# Patient Record
Sex: Male | Born: 1960 | Race: Black or African American | Hispanic: No | Marital: Married | Smoking: Former smoker
Health system: Southern US, Community
[De-identification: ages and names within clinical notes are randomized; demographics above are authoritative.]

## PROBLEM LIST (undated history)

## (undated) DIAGNOSIS — C189 Malignant neoplasm of colon, unspecified: Secondary | ICD-10-CM

## (undated) DIAGNOSIS — I10 Essential (primary) hypertension: Secondary | ICD-10-CM

## (undated) HISTORY — PX: COLOSTOMY: SHX63

## (undated) HISTORY — PX: PORTACATH PLACEMENT: SHX2246

## (undated) HISTORY — PX: HERNIA REPAIR: SHX51

---

## 2015-04-29 ENCOUNTER — Emergency Department
Admission: EM | Admit: 2015-04-29 | Discharge: 2015-04-29 | Disposition: A | Discharge status: Home / Self Care | Payer: Medicaid Other | Attending: Emergency Medicine | Admitting: Emergency Medicine

## 2015-04-29 ENCOUNTER — Encounter: Payer: Self-pay | Admitting: *Deleted

## 2015-04-29 ENCOUNTER — Emergency Department: Payer: Medicaid Other

## 2015-04-29 DIAGNOSIS — R110 Nausea: Secondary | ICD-10-CM | POA: Diagnosis not present

## 2015-04-29 DIAGNOSIS — R109 Unspecified abdominal pain: Secondary | ICD-10-CM

## 2015-04-29 DIAGNOSIS — Z933 Colostomy status: Secondary | ICD-10-CM | POA: Insufficient documentation

## 2015-04-29 DIAGNOSIS — R1010 Upper abdominal pain, unspecified: Secondary | ICD-10-CM | POA: Insufficient documentation

## 2015-04-29 DIAGNOSIS — Z87891 Personal history of nicotine dependence: Secondary | ICD-10-CM | POA: Diagnosis not present

## 2015-04-29 HISTORY — DX: Essential (primary) hypertension: I10

## 2015-04-29 HISTORY — DX: Malignant neoplasm of colon, unspecified: C18.9

## 2015-04-29 LAB — COMPREHENSIVE METABOLIC PANEL
ALT: 27 U/L (ref 17–63)
AST: 26 U/L (ref 15–41)
Albumin: 4.3 g/dL (ref 3.5–5.0)
Alkaline Phosphatase: 60 U/L (ref 38–126)
Anion gap: 9 (ref 5–15)
BUN: 15 mg/dL (ref 6–20)
CHLORIDE: 100 mmol/L — AB (ref 101–111)
CO2: 26 mmol/L (ref 22–32)
Calcium: 9.5 mg/dL (ref 8.9–10.3)
Creatinine, Ser: 1.03 mg/dL (ref 0.61–1.24)
Glucose, Bld: 116 mg/dL — ABNORMAL HIGH (ref 65–99)
POTASSIUM: 4.2 mmol/L (ref 3.5–5.1)
SODIUM: 135 mmol/L (ref 135–145)
Total Bilirubin: 0.7 mg/dL (ref 0.3–1.2)
Total Protein: 8 g/dL (ref 6.5–8.1)

## 2015-04-29 LAB — CBC
HCT: 40.8 % (ref 40.0–52.0)
Hemoglobin: 14 g/dL (ref 13.0–18.0)
MCH: 28.3 pg (ref 26.0–34.0)
MCHC: 34.3 g/dL (ref 32.0–36.0)
MCV: 82.7 fL (ref 80.0–100.0)
PLATELETS: 202 10*3/uL (ref 150–440)
RBC: 4.93 MIL/uL (ref 4.40–5.90)
RDW: 14.3 % (ref 11.5–14.5)
WBC: 5.2 10*3/uL (ref 3.8–10.6)

## 2015-04-29 LAB — TROPONIN I

## 2015-04-29 LAB — LIPASE, BLOOD: LIPASE: 30 U/L (ref 11–51)

## 2015-04-29 MED ORDER — IOHEXOL 240 MG/ML SOLN
25.0000 mL | INTRAMUSCULAR | Status: AC
  Administered 2015-04-29: 25 mL via ORAL

## 2015-04-29 MED ORDER — OXYCODONE HCL 5 MG PO TABS
10.0000 mg | ORAL_TABLET | ORAL | Status: AC
  Administered 2015-04-29: 10 mg via ORAL
  Filled 2015-04-29: qty 2

## 2015-04-29 MED ORDER — MORPHINE SULFATE (PF) 4 MG/ML IV SOLN
4.0000 mg | Freq: Once | INTRAVENOUS | Status: AC
  Administered 2015-04-29: 4 mg via INTRAVENOUS
  Filled 2015-04-29: qty 1

## 2015-04-29 MED ORDER — OXYCODONE-ACETAMINOPHEN 10-325 MG PO TABS: 1.0000 | ORAL_TABLET | Freq: Four times a day (QID) | ORAL | Status: AC | PRN

## 2015-04-29 MED ORDER — ONDANSETRON HCL 4 MG/2ML IJ SOLN
4.0000 mg | Freq: Once | INTRAMUSCULAR | Status: AC
  Administered 2015-04-29: 4 mg via INTRAVENOUS
  Filled 2015-04-29: qty 2

## 2015-04-29 MED ORDER — SODIUM CHLORIDE 0.9 % IV BOLUS (SEPSIS)
1000.0000 mL | Freq: Once | INTRAVENOUS | Status: AC
  Administered 2015-04-29: 1000 mL via INTRAVENOUS

## 2015-04-29 MED ORDER — IOHEXOL 300 MG/ML  SOLN
100.0000 mL | Freq: Once | INTRAMUSCULAR | Status: AC | PRN
  Administered 2015-04-29: 100 mL via INTRAVENOUS

## 2015-04-29 NOTE — Discharge Instructions (Signed)
Please seek medical attention for any high fevers, chest pain, shortness of breath, change in behavior, persistent vomiting, bloody stool or any other new or concerning symptoms. ° ° °Abdominal Pain, Adult °Many things can cause abdominal pain. Usually, abdominal pain is not caused by a disease and will improve without treatment. It can often be observed and treated at home. Your health care provider will do a physical exam and possibly order blood tests and X-rays to help determine the seriousness of your pain. However, in many cases, more time must pass before a clear cause of the pain can be found. Before that point, your health care provider may not know if you need more testing or further treatment. °HOME CARE INSTRUCTIONS °Monitor your abdominal pain for any changes. The following actions may help to alleviate any discomfort you are experiencing: °· Only take over-the-counter or prescription medicines as directed by your health care provider. °· Do not take laxatives unless directed to do so by your health care provider. °· Try a clear liquid diet (broth, tea, or water) as directed by your health care provider. Slowly move to a bland diet as tolerated. °SEEK MEDICAL CARE IF: °· You have unexplained abdominal pain. °· You have abdominal pain associated with nausea or diarrhea. °· You have pain when you urinate or have a bowel movement. °· You experience abdominal pain that wakes you in the night. °· You have abdominal pain that is worsened or improved by eating food. °· You have abdominal pain that is worsened with eating fatty foods. °· You have a fever. °SEEK IMMEDIATE MEDICAL CARE IF: °· Your pain does not go away within 2 hours. °· You keep throwing up (vomiting). °· Your pain is felt only in portions of the abdomen, such as the right side or the left lower portion of the abdomen. °· You pass bloody or black tarry stools. °MAKE SURE YOU: °· Understand these instructions. °· Will watch your  condition. °· Will get help right away if you are not doing well or get worse. °  °This information is not intended to replace advice given to you by your health care provider. Make sure you discuss any questions you have with your health care provider. °  °Document Released: 03/25/2005 Document Revised: 03/06/2015 Document Reviewed: 02/22/2013 °Elsevier Interactive Patient Education ©2016 [employer] ° °

## 2015-04-29 NOTE — ED Provider Notes (Signed)
[facility] Emergency Department Provider Note   ____________________________________________  Time seen: 1800  I have reviewed the triage vital signs and the nursing notes.   HISTORY  Chief Complaint Abdominal Cramping   History limited by: Not Limited   HPI Darin Davis is a 54 y.o. 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 He states that the pain has been present and a mild extent for the past month and half ever since his resection. He states today while he was sitting down he had severe increase in pain. It is located in the upper abdomen. He has had some nausea but no vomiting. He states that his output in his colostomy has been normal. He has notany blood in it recently. No fevers.   Past Medical History  Diagnosis Date  . Colon cancer ([place])   . Colon cancer ([place])     There are no active problems to display for this patient.   Past Surgical History  Procedure Laterality Date  . Colostomy    . Hernia repair    . Portacath placement      No current outpatient prescriptions on file.  Allergies Review of patient's allergies indicates no known allergies.  History reviewed. No pertinent family history.  Social History Social History  Substance Use Topics  . Smoking status: Former Research scientist (life sciences)  . Smokeless tobacco: None  . Alcohol Use: No    Review of Systems  Constitutional: Negative for fever. Cardiovascular: Negative for chest pain. Respiratory: Negative for shortness of breath. Gastrointestinal: Positive for abdominal pain Genitourinary: Negative for dysuria. Musculoskeletal: Negative for back pain. Skin: Negative for rash. Neurological: Negative for headaches, focal weakness or numbness.  10-point ROS otherwise negative.  ____________________________________________   PHYSICAL EXAM:  VITAL SIGNS: ED Triage Vitals  Enc  Vitals Group     BP 04/29/15 1710 148/87 mmHg     Pulse Rate 04/29/15 1710 80     Resp 04/29/15 1710 20     Temp 04/29/15 1710 98.3 F (36.8 C)     Temp Source 04/29/15 1710 Oral     SpO2 04/29/15 1710 100 %     Weight 04/29/15 1710 168 lb (76.204 kg)     Height 04/29/15 1710 5\' 11"  (1.803 m)     Head Cir --      Peak Flow --      Pain Score 04/29/15 1711 8   Constitutional: Alert and oriented. Well appearing and in no distress. Eyes: Conjunctivae are normal. PERRL. Normal extraocular movements. ENT   Head: Normocephalic and atraumatic.   Nose: No congestion/rhinnorhea.   Mouth/Throat: Mucous membranes are moist.   Neck: No stridor. Hematological/Lymphatic/Immunilogical: No cervical lymphadenopathy. Cardiovascular: Normal rate, regular rhythm.  No murmurs, rubs, or gallops. Respiratory: Normal respiratory effort without tachypnea nor retractions. Breath sounds are clear and equal bilaterally. No wheezes/rales/rhonchi. Gastrointestinal: Soft and tender to palpation in the upper abdomen. Mild tympany. Colostomy bag with brown liquid fecal matter. Genitourinary: Deferred Musculoskeletal: Normal range of motion in all extremities. No joint effusions.  No lower extremity tenderness nor edema. Neurologic:  Normal speech and language. No gross focal neurologic deficits are appreciated.  Skin:  Skin is warm, dry and intact. No rash noted. Psychiatric: Mood and affect are normal. Speech and behavior are normal. Patient exhibits appropriate insight and judgment.  ____________________________________________    LABS (pertinent positives/negatives)  Labs Reviewed  COMPREHENSIVE METABOLIC PANEL -  Abnormal; Notable for the following:    Chloride 100 (*)    Glucose, Bld 116 (*)    All other components within normal limits  LIPASE, BLOOD  CBC  TROPONIN I    ____________________________________________   EKG  I, Nance Pear, attending physician, personally viewed and  interpreted this EKG  EKG Time: 1717 Rate: 80 Rhythm: NSR Axis: normal Intervals: qtc 422 QRS: narrow, rsr' pattern in V1 ST changes: no st elevation Impression: abnormal ekg ____________________________________________    RADIOLOGY  CT abdomen and pelvis IMPRESSION: 1. Dilated loop of jejunum in the left mid abdomen. This could be due to focal ileus or partial obstruction. 2. Moderate diffuse inferior rectal wall thickening. This could be due to a neoplastic process or an inflammatory/infectious process. 3. Multiple small liver masses, most likely representing metastases. 4. Small probable postoperative fluid collection in the central pelvis, posteriorly, at the level of the dome of the urinary bladder.  ____________________________________________   PROCEDURES  Procedure(s) performed: None  Critical Care performed: No  ____________________________________________   INITIAL IMPRESSION / ASSESSMENT AND PLAN / ED COURSE  Pertinent labs & imaging results that were available during my care of the patient were reviewed by me and considered in my medical decision making (see chart for details).  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 Initial exam patient did have some tenderness to palpation of the upper abdomen. A CT scan was performed which showed a possible distended jejunum concerning for possible partial obstruction. Additionally there was a postoperative fluid collection seen near the urinary bladder. The patient has not had any fevers and certainly did not have any fever or leukocytosis here. I have low suspicion that this fluid collection is infectious. The patient did feel better after pain medication and was able to tolerate by mouth. Will plan on discharging home to follow up with his surgeons.  ____________________________________________   FINAL CLINICAL  IMPRESSION(S) / ED DIAGNOSES  Final diagnoses:  Abdominal pain, unspecified abdominal location     Nance Pear, MD 04/29/15 2254

## 2015-04-29 NOTE — ED Notes (Signed)
Pain across upper abd radiating around to back past several days, chemo last week for colon ca

## 2015-04-29 NOTE — ED Notes (Signed)
Pt dc home ambulatory with brother pt rates pain 2/10 instructed on follow up plan and med use PT NAD AT DC

## 2016-06-24 ENCOUNTER — Encounter (HOSPITAL_COMMUNITY): Payer: Self-pay

## 2016-06-24 DIAGNOSIS — Z85038 Personal history of other malignant neoplasm of large intestine: Secondary | ICD-10-CM | POA: Diagnosis not present

## 2016-06-24 DIAGNOSIS — I10 Essential (primary) hypertension: Secondary | ICD-10-CM | POA: Diagnosis not present

## 2016-06-24 DIAGNOSIS — Z79899 Other long term (current) drug therapy: Secondary | ICD-10-CM | POA: Insufficient documentation

## 2016-06-24 DIAGNOSIS — Z87891 Personal history of nicotine dependence: Secondary | ICD-10-CM | POA: Diagnosis not present

## 2016-06-24 DIAGNOSIS — R109 Unspecified abdominal pain: Secondary | ICD-10-CM | POA: Diagnosis present

## 2016-06-24 DIAGNOSIS — R1032 Left lower quadrant pain: Secondary | ICD-10-CM | POA: Insufficient documentation

## 2016-06-24 NOTE — ED Triage Notes (Signed)
Patient c/o back pain greater than a month, but progressively worse in the past few days. Patient has been taking oxycodone 5/325and neurotin with no relief. Patient has a history of colon cancer and received last treatment 2 weeks ago.

## 2016-06-25 ENCOUNTER — Emergency Department (HOSPITAL_COMMUNITY): Payer: Medicaid Other

## 2016-06-25 ENCOUNTER — Emergency Department (HOSPITAL_COMMUNITY)
Admission: EM | Admit: 2016-06-25 | Discharge: 2016-06-25 | Disposition: A | Discharge status: Home / Self Care | Payer: Medicaid Other | Attending: Emergency Medicine | Admitting: Emergency Medicine

## 2016-06-25 ENCOUNTER — Encounter (HOSPITAL_COMMUNITY): Payer: Self-pay

## 2016-06-25 DIAGNOSIS — R1030 Lower abdominal pain, unspecified: Secondary | ICD-10-CM

## 2016-06-25 LAB — CBC WITH DIFFERENTIAL/PLATELET
BASOS PCT: 1 %
Basophils Absolute: 0 10*3/uL (ref 0.0–0.1)
EOS ABS: 0.1 10*3/uL (ref 0.0–0.7)
Eosinophils Relative: 1 %
HCT: 34.2 % — ABNORMAL LOW (ref 39.0–52.0)
Hemoglobin: 11.1 g/dL — ABNORMAL LOW (ref 13.0–17.0)
Lymphocytes Relative: 44 %
Lymphs Abs: 2.8 10*3/uL (ref 0.7–4.0)
MCH: 28.8 pg (ref 26.0–34.0)
MCHC: 32.5 g/dL (ref 30.0–36.0)
MCV: 88.6 fL (ref 78.0–100.0)
MONO ABS: 0.6 10*3/uL (ref 0.1–1.0)
MONOS PCT: 9 %
Neutro Abs: 2.8 10*3/uL (ref 1.7–7.7)
Neutrophils Relative %: 45 %
Platelets: 247 10*3/uL (ref 150–400)
RBC: 3.86 MIL/uL — ABNORMAL LOW (ref 4.22–5.81)
RDW: 16.8 % — AB (ref 11.5–15.5)
WBC: 6.2 10*3/uL (ref 4.0–10.5)

## 2016-06-25 LAB — COMPREHENSIVE METABOLIC PANEL
ALBUMIN: 4 g/dL (ref 3.5–5.0)
ALK PHOS: 66 U/L (ref 38–126)
ALT: 25 U/L (ref 17–63)
AST: 29 U/L (ref 15–41)
Anion gap: 8 (ref 5–15)
BILIRUBIN TOTAL: 0.6 mg/dL (ref 0.3–1.2)
BUN: 10 mg/dL (ref 6–20)
CALCIUM: 9.6 mg/dL (ref 8.9–10.3)
CO2: 27 mmol/L (ref 22–32)
Chloride: 100 mmol/L — ABNORMAL LOW (ref 101–111)
Creatinine, Ser: 0.67 mg/dL (ref 0.61–1.24)
GFR calc Af Amer: >60 mL/min (ref >60)
GFR calc non Af Amer: >60 mL/min (ref >60)
GLUCOSE: 123 mg/dL — AB (ref 65–99)
Potassium: 3.8 mmol/L (ref 3.5–5.1)
Sodium: 135 mmol/L (ref 135–145)
TOTAL PROTEIN: 7.3 g/dL (ref 6.5–8.1)

## 2016-06-25 LAB — LIPASE, BLOOD: Lipase: 27 U/L (ref 11–51)

## 2016-06-25 MED ORDER — HYDROMORPHONE HCL 2 MG/ML IJ SOLN: 1.0000 mg | Freq: Once | INTRAMUSCULAR | Status: AC

## 2016-06-25 MED ORDER — OXYCODONE-ACETAMINOPHEN 5-325 MG PO TABS: 1.0000 | ORAL_TABLET | Freq: Four times a day (QID) | ORAL | 0 refills | Status: AC | PRN

## 2016-06-25 MED ORDER — IOPAMIDOL (ISOVUE-300) INJECTION 61%
100.0000 mL | Freq: Once | INTRAVENOUS | Status: DC | PRN
Start: 2016-06-25 — End: 2016-06-25

## 2016-06-25 MED ORDER — HYDROMORPHONE HCL 2 MG/ML IJ SOLN
1.0000 mg | Freq: Once | INTRAMUSCULAR | Status: AC
  Administered 2016-06-25: 1 mg via INTRAMUSCULAR
  Filled 2016-06-25: qty 1

## 2016-06-25 MED ORDER — IOPAMIDOL (ISOVUE-300) INJECTION 61%
INTRAVENOUS | Status: AC
  Filled 2016-06-25: qty 100

## 2016-06-25 NOTE — ED Notes (Signed)
Patient is alert and oriented x3.  He was given DC instructions and follow up visit instructions.  Patient gave verbal understanding.  He was DC ambulatory under his own power to home.  V/S stable.  He was not showing any signs of distress on DC 

## 2016-06-25 NOTE — Discharge Instructions (Signed)
Please keep all scheduled appointments. Follow up with your oncologist to discuss improving pain control.  Return to ER for new or worsening symptoms, any additional concerns.

## 2016-06-25 NOTE — ED Provider Notes (Signed)
[facility] DEPT Provider Note   CSN: [id number] Arrival date & time: 06/24/16  1751   By signing my name below, I, Eunice Blase, attest that this documentation has been prepared under the direction and in the presence of [facility]. Electronically Signed: Eunice Blase, Scribe. 06/25/16. 5:04 AM.   History   Chief Complaint Chief Complaint  Patient presents with  . Back Pain  . cancer patient   The history is provided by the patient and medical records. No language interpreter was used.    HPI Comments: Darin Davis is a 55 y.o. male with PMH of stage IV colon cancer who presents to the Emergency Department complaining of Gradually worsening left-sided abdominal pain, flank pain, back pain for the last month. Patient states the pain has become intolerable over the past few days. He has taken his home 5 mg oxycodone with little relief and is now out of this medication. He has been trying to apply heating pads home with little relief. He endorses associated decreased appetite, but no nausea or vomiting. No fevers or chills.  He has a follow-up appointment with hem/onc on 07/06/2016.   Past Medical History:  Diagnosis Date  . Colon cancer ([place])   . Colon cancer ([place])   . Hypertension     There are no active problems to display for this patient.   Past Surgical History:  Procedure Laterality Date  . COLOSTOMY    . HERNIA REPAIR    . PORTACATH PLACEMENT         Home Medications    Prior to Admission medications   Medication Sig Start Date End Date Taking? Authorizing Provider  oxyCODONE-acetaminophen (PERCOCET/ROXICET) 5-325 MG tablet Take 1-2 tablets by mouth every 6 (six) hours as needed for severe pain. 06/25/16   Ozella Almond Ward, PA-C    Family History Family History  Problem Relation Age of Onset  . Heart failure Mother     Social History Social History  Substance Use Topics  . Smoking status: Former Research scientist (life sciences)  . Smokeless tobacco: Never Used  .  Alcohol use No     Allergies   Patient has no known allergies.   Review of Systems Review of Systems  Constitutional: Positive for appetite change (Diminished).  Gastrointestinal: Positive for abdominal pain. Negative for nausea and vomiting.  Musculoskeletal: Positive for back pain.  All other systems reviewed and are negative.    Physical Exam Updated Vital Signs BP 180/99 (BP Location: Left Arm)   Pulse 67   Temp 98.4 F (36.9 C) (Oral)   Resp 15   Ht 6' (1.829 m)   Wt 78 kg   SpO2 100%   BMI 23.33 kg/m   Physical Exam  Constitutional: He is oriented to person, place, and time. He appears well-developed and well-nourished. No distress.  HENT:  Head: Normocephalic and atraumatic.  Cardiovascular: Normal rate, regular rhythm and normal heart sounds.   No murmur heard. Pulmonary/Chest: Effort normal and breath sounds normal. No respiratory distress.  Abdominal: Soft. He exhibits no distension.  Colostomy in place. No abdominal tenderness.   Musculoskeletal:  No midline C/T/L spine tenderness.  Neurological: He is alert and oriented to person, place, and time.  Skin: Skin is warm and dry.  Nursing note and vitals reviewed.    ED Treatments / Results  DIAGNOSTIC STUDIES: Oxygen Saturation is 99% on RA, normal by my interpretation.    COORDINATION OF CARE: 5:04 AM Discussed treatment plan with pt at bedside and pt agreed  to plan.  Labs (all labs ordered are listed, but only abnormal results are displayed) Labs Reviewed  COMPREHENSIVE METABOLIC PANEL - Abnormal; Notable for the following:       Result Value   Chloride 100 (*)    Glucose, Bld 123 (*)    All other components within normal limits  CBC WITH DIFFERENTIAL/PLATELET - Abnormal; Notable for the following:    RBC 3.86 (*)    Hemoglobin 11.1 (*)    HCT 34.2 (*)    RDW 16.8 (*)    All other components within normal limits  LIPASE, BLOOD    EKG  EKG Interpretation None       Radiology No  results found.  Procedures Procedures (including critical care time)  Medications Ordered in ED Medications  iopamidol (ISOVUE-300) 61 % injection 100 mL (not administered)  iopamidol (ISOVUE-300) 61 % injection (not administered)  HYDROmorphone (DILAUDID) injection 1 mg ( Intravenous See Alternative 06/25/16 0131)    Or  HYDROmorphone (DILAUDID) injection 1 mg (1 mg Intramuscular Given 06/25/16 0131)     Initial Impression / Assessment and Plan / ED Course  I have reviewed the triage vital signs and the nursing notes.  Pertinent labs & imaging results that were available during my care of the patient were reviewed by me and considered in my medical decision making (see chart for details).  Clinical Course    Darin Davis is a 55 y.o. male who presents to ED for progressively worsening left sided abdominal, flank, back pain x 1 month. Hx of stage IV colon cancer undergoing chemotherapy. Labs reviewed and reassuring. Pain managed in ED. CT abd/pelvis ordered, however on re-evaluation, patient stated that he no longer wanted to wait for CT scan. He has follow up with hem/onc on 1/08 and states they can do CT as an outpatient. The patient has tolerated PO while in the ED. Abdominal exam with no peritoneal signs. Strict return precautions were discussed. All questions.   Patient seen by and discussed with Dr. Regenia Skeeter who agrees with treatment plan.   Final Clinical Impressions(s) / ED Diagnoses   Final diagnoses:  None    New Prescriptions Discharge Medication List as of 06/25/2016  3:21 AM    START taking these medications   Details  oxyCODONE-acetaminophen (PERCOCET/ROXICET) 5-325 MG tablet Take 1-2 tablets by mouth every 6 (six) hours as needed for severe pain., Starting Thu 06/25/2016, Print       I personally performed the services described in this documentation, which was scribed in my presence. The recorded information has been reviewed and is accurate.    [facility] Ward, PA-C 06/25/16 [id number]    Sherwood Gambler, MD 06/25/16 [phone]

## 2016-06-30 ENCOUNTER — Encounter: Payer: Self-pay | Admitting: *Deleted

## 2016-06-30 ENCOUNTER — Telehealth: Payer: Self-pay | Admitting: Hematology

## 2016-06-30 NOTE — Progress Notes (Signed)
Oncology Nurse Navigator Documentation  Oncology Nurse Navigator Flowsheets 06/30/2016  Navigator Location [facility]  Referral date to RadOnc/MedOnc 06/25/2016  Navigator Encounter Type Other  Abnormal Finding Date 02/27/2015  Confirmed Diagnosis Date 02/28/2015  Surgery Date 03/01/2015  Self-referral per wife to be seen in [facility] (declined [place] location). Wishes to transfer care from [facility] in [place] to [place]. Unable to download/review office notes or treatment records from [place]. Message to HIM Seth Bake) to obtain office notes, treatment records, surgical report and pathology from [facility] and have it scanned into chart. Offer appointment for 07/09/16 at 2:30 with Dr. Burr Medico. Patietn will need to bring his last CT scan on CD as well to the appointment (01/14/2016).

## 2016-06-30 NOTE — Telephone Encounter (Signed)
Tc to the pt's spouse to inform her of an appt date and time. Appt has been scheduled for the pt to see Dr. Burr Medico on 1/11 at 230pm. Demographics verified. Made Mrs. Crocker, pt's spouse, aware of the information needed prior to the scheduled appt date and time. Explained that she would need to fill out a records release from [facility] for the records to be transferred. Voiced understanding. Pt is scheduled to have a CT done on Mon 1/8.

## 2016-07-04 ENCOUNTER — Emergency Department (HOSPITAL_COMMUNITY): Payer: Medicaid Other

## 2016-07-04 ENCOUNTER — Encounter (HOSPITAL_COMMUNITY): Payer: Self-pay | Admitting: Emergency Medicine

## 2016-07-04 ENCOUNTER — Emergency Department (HOSPITAL_COMMUNITY)
Admission: EM | Admit: 2016-07-04 | Discharge: 2016-07-04 | Disposition: A | Discharge status: Home / Self Care | Payer: Medicaid Other | Attending: Emergency Medicine | Admitting: Emergency Medicine

## 2016-07-04 DIAGNOSIS — G893 Neoplasm related pain (acute) (chronic): Secondary | ICD-10-CM | POA: Diagnosis present

## 2016-07-04 DIAGNOSIS — I10 Essential (primary) hypertension: Secondary | ICD-10-CM | POA: Diagnosis not present

## 2016-07-04 DIAGNOSIS — Z79899 Other long term (current) drug therapy: Secondary | ICD-10-CM | POA: Diagnosis not present

## 2016-07-04 DIAGNOSIS — Z85038 Personal history of other malignant neoplasm of large intestine: Secondary | ICD-10-CM | POA: Insufficient documentation

## 2016-07-04 DIAGNOSIS — Z87891 Personal history of nicotine dependence: Secondary | ICD-10-CM | POA: Diagnosis not present

## 2016-07-04 LAB — URINALYSIS, ROUTINE W REFLEX MICROSCOPIC
Bilirubin Urine: NEGATIVE
GLUCOSE, UA: NEGATIVE mg/dL
Ketones, ur: NEGATIVE mg/dL
Nitrite: NEGATIVE
Protein, ur: NEGATIVE mg/dL
SPECIFIC GRAVITY, URINE: 1.025 (ref 1.005–1.030)
pH: 5.5 (ref 5.0–8.0)

## 2016-07-04 LAB — CBC WITH DIFFERENTIAL/PLATELET
Basophils Absolute: 0 10*3/uL (ref 0.0–0.1)
Basophils Relative: 0 %
EOS PCT: 3 %
Eosinophils Absolute: 0.2 10*3/uL (ref 0.0–0.7)
HCT: 37.5 % — ABNORMAL LOW (ref 39.0–52.0)
Hemoglobin: 12.4 g/dL — ABNORMAL LOW (ref 13.0–17.0)
LYMPHS ABS: 1.2 10*3/uL (ref 0.7–4.0)
LYMPHS PCT: 22 %
MCH: 29.1 pg (ref 26.0–34.0)
MCHC: 33.1 g/dL (ref 30.0–36.0)
MCV: 88 fL (ref 78.0–100.0)
MONO ABS: 0.8 10*3/uL (ref 0.1–1.0)
Monocytes Relative: 15 %
Neutro Abs: 3.3 10*3/uL (ref 1.7–7.7)
Neutrophils Relative %: 60 %
PLATELETS: 265 10*3/uL (ref 150–400)
RBC: 4.26 MIL/uL (ref 4.22–5.81)
RDW: 16.4 % — ABNORMAL HIGH (ref 11.5–15.5)
WBC: 5.5 10*3/uL (ref 4.0–10.5)

## 2016-07-04 LAB — COMPREHENSIVE METABOLIC PANEL
ALBUMIN: 4.6 g/dL (ref 3.5–5.0)
ALT: 21 U/L (ref 17–63)
ANION GAP: 11 (ref 5–15)
AST: 30 U/L (ref 15–41)
Alkaline Phosphatase: 74 U/L (ref 38–126)
BUN: 10 mg/dL (ref 6–20)
CALCIUM: 9.9 mg/dL (ref 8.9–10.3)
CHLORIDE: 96 mmol/L — AB (ref 101–111)
CO2: 25 mmol/L (ref 22–32)
Creatinine, Ser: 0.72 mg/dL (ref 0.61–1.24)
GFR calc non Af Amer: >60 mL/min (ref >60)
GLUCOSE: 108 mg/dL — AB (ref 65–99)
POTASSIUM: 3.8 mmol/L (ref 3.5–5.1)
SODIUM: 132 mmol/L — AB (ref 135–145)
Total Bilirubin: 0.8 mg/dL (ref 0.3–1.2)
Total Protein: 8.2 g/dL — ABNORMAL HIGH (ref 6.5–8.1)

## 2016-07-04 LAB — LIPASE, BLOOD: LIPASE: 17 U/L (ref 11–51)

## 2016-07-04 LAB — URINALYSIS, MICROSCOPIC (REFLEX)

## 2016-07-04 MED ORDER — ONDANSETRON HCL 4 MG/2ML IJ SOLN
4.0000 mg | Freq: Once | INTRAMUSCULAR | Status: AC
  Administered 2016-07-04: 4 mg via INTRAVENOUS
  Filled 2016-07-04: qty 2

## 2016-07-04 MED ORDER — HYDROMORPHONE HCL 1 MG/ML IJ SOLN
1.0000 mg | Freq: Once | INTRAMUSCULAR | Status: AC
  Administered 2016-07-04: 1 mg via INTRAVENOUS
  Filled 2016-07-04: qty 1

## 2016-07-04 MED ORDER — OXYCODONE HCL 5 MG PO TABS: 5.0000 mg | ORAL_TABLET | ORAL | 0 refills | Status: DC | PRN

## 2016-07-04 MED ORDER — OXYCODONE HCL 5 MG PO TABS
10.0000 mg | ORAL_TABLET | Freq: Once | ORAL | Status: AC
  Administered 2016-07-04: 10 mg via ORAL
  Filled 2016-07-04: qty 2

## 2016-07-04 MED ORDER — IOPAMIDOL (ISOVUE-300) INJECTION 61%
100.0000 mL | Freq: Once | INTRAVENOUS | Status: AC | PRN
  Administered 2016-07-04: 100 mL via INTRAVENOUS

## 2016-07-04 MED ORDER — IOPAMIDOL (ISOVUE-300) INJECTION 61%
INTRAVENOUS | Status: AC
  Filled 2016-07-04: qty 100

## 2016-07-04 MED ORDER — SODIUM CHLORIDE 0.9 % IV BOLUS (SEPSIS)
1000.0000 mL | Freq: Once | INTRAVENOUS | Status: AC
Start: 2016-07-04 — End: 2016-07-04
  Administered 2016-07-04: 1000 mL via INTRAVENOUS

## 2016-07-04 NOTE — ED Triage Notes (Signed)
Patient complaining of pain in lower back that radiates up spine. Patient is also complaining of lower abdominal pain on left and right side. Patient has a hx of cancer. Patient has not vomited but was nauseated. Patient is in a lot of pain. He states that his pain medicine is not working.

## 2016-07-04 NOTE — ED Provider Notes (Signed)
[facility] DEPT Provider Note   CSN: [id number] Arrival date & time: 07/04/16  0607     History   Chief Complaint Chief Complaint  Patient presents with  . Back Pain  . Abdominal Pain    HPI Darin Davis is a 56 y.o. male.  HPI  56 yo M with h/o colon CA here with lower abdominal pain, LLQ pain. Pt was recently seen 1-1.5 weeks ago and was noticed to have LLQ pain that is sharp and stabbing. He did not want to wait for CT and left with plan for outpt CT in 2 days. Pt states he has had persistent sharp, aching, stabbing LLQ pain with nausea. Pain is constant and worse with palpation, movement. No alleviating factors. He denies change in ostomy output. He has had decreased appetite associated with it. No fever or chills. No weight loss or night sweats.  Past Medical History:  Diagnosis Date  . Colon cancer ([place])   . Colon cancer ([place])   . Hypertension     There are no active problems to display for this patient.   Past Surgical History:  Procedure Laterality Date  . COLOSTOMY    . HERNIA REPAIR    . PORTACATH PLACEMENT         Home Medications    Prior to Admission medications   Medication Sig Start Date End Date Taking? Authorizing Provider  gabapentin (NEURONTIN) 300 MG capsule Take 300 mg by mouth daily. 02/12/16 02/11/17 Yes Historical Provider, MD  Menthol-Camphor ([place]) 16-11 % CREA Apply 1 application topically daily as needed (PAIN).   Yes Historical Provider, MD  omeprazole (PRILOSEC) 40 MG capsule Take 40 mg by mouth daily. 04/20/16  Yes Historical Provider, MD  oxyCODONE-acetaminophen (PERCOCET/ROXICET) 5-325 MG tablet Take 1-2 tablets by mouth every 6 (six) hours as needed for severe pain. 06/25/16  Yes Jaime Pilcher Ward, PA-C  VOLTAREN 1 % GEL Apply 1 application topically daily. 04/19/16  Yes Historical Provider, MD  oxyCODONE (ROXICODONE) 5 MG immediate release tablet Take 1-2 tablets (5-10 mg total) by mouth every 4 (four) hours  as needed for severe pain or breakthrough pain. 07/04/16   Duffy Bruce, MD    Family History Family History  Problem Relation Age of Onset  . Heart failure Mother     Social History Social History  Substance Use Topics  . Smoking status: Former Research scientist (life sciences)  . Smokeless tobacco: Never Used  . Alcohol use No     Allergies   Patient has no known allergies.   Review of Systems Review of Systems  Constitutional: Positive for appetite change and fatigue. Negative for chills and fever.  HENT: Negative for congestion and rhinorrhea.   Eyes: Negative for visual disturbance.  Respiratory: Negative for cough, shortness of breath and wheezing.   Cardiovascular: Negative for chest pain and leg swelling.  Gastrointestinal: Positive for abdominal pain and nausea. Negative for diarrhea and vomiting.  Genitourinary: Negative for dysuria and flank pain.  Musculoskeletal: Negative for neck pain and neck stiffness.  Skin: Negative for rash and wound.  Allergic/Immunologic: Negative for immunocompromised state.  Neurological: Negative for syncope, weakness and headaches.  All other systems reviewed and are negative.    Physical Exam Updated Vital Signs BP 153/98 (BP Location: Right Arm)   Pulse 80   Temp 98.5 F (36.9 C) (Oral)   Resp 17   Ht 6' (1.829 m)   Wt 172 lb (78 kg)   SpO2 96%   BMI  23.33 kg/m   Physical Exam  Constitutional: He is oriented to person, place, and time. He appears well-developed and well-nourished. No distress.  HENT:  Head: Normocephalic and atraumatic.  Eyes: Conjunctivae are normal.  Neck: Neck supple.  Cardiovascular: Normal rate, regular rhythm and normal heart sounds.  Exam reveals no friction rub.   No murmur heard. Pulmonary/Chest: Effort normal and breath sounds normal. No respiratory distress. He has no wheezes. He has no rales.  Abdominal: Soft. Normal appearance. He exhibits no distension. There is tenderness in the right lower quadrant,  suprapubic area and left lower quadrant. There is guarding. There is no rigidity and no rebound.  Musculoskeletal: He exhibits no edema.  Neurological: He is alert and oriented to person, place, and time. He exhibits normal muscle tone.  Skin: Skin is warm. Capillary refill takes less than 2 seconds.  Psychiatric: He has a normal mood and affect.  Nursing note and vitals reviewed.    ED Treatments / Results  Labs (all labs ordered are listed, but only abnormal results are displayed) Labs Reviewed  COMPREHENSIVE METABOLIC PANEL - Abnormal; Notable for the following:       Result Value   Sodium 132 (*)    Chloride 96 (*)    Glucose, Bld 108 (*)    Total Protein 8.2 (*)    All other components within normal limits  URINALYSIS, ROUTINE W REFLEX MICROSCOPIC - Abnormal; Notable for the following:    Hgb urine dipstick SMALL (*)    Leukocytes, UA TRACE (*)    All other components within normal limits  CBC WITH DIFFERENTIAL/PLATELET - Abnormal; Notable for the following:    Hemoglobin 12.4 (*)    HCT 37.5 (*)    RDW 16.4 (*)    All other components within normal limits  URINALYSIS, MICROSCOPIC (REFLEX) - Abnormal; Notable for the following:    Bacteria, UA RARE (*)    Squamous Epithelial / LPF 0-5 (*)    All other components within normal limits  LIPASE, BLOOD    EKG  EKG Interpretation None       Radiology Ct Abdomen Pelvis W Contrast  Result Date: 07/04/2016 CLINICAL DATA:  Lower abdominal pain, history of colon carcinoma EXAM: CT ABDOMEN AND PELVIS WITH CONTRAST TECHNIQUE: Multidetector CT imaging of the abdomen and pelvis was performed using the standard protocol following bolus administration of intravenous contrast. CONTRAST:  134mL ISOVUE-300 IOPAMIDOL (ISOVUE-300) INJECTION 61% COMPARISON:  04/29/2015 FINDINGS: Lower chest: No acute abnormality. Hepatobiliary: The liver is well visualized and demonstrates a few scattered hypodensities but less present than that seen on  the prior exam. These may represent small cysts although the possibility of metastatic disease deserves consideration given the patient's clinical history. The gallbladder is within normal limits. Pancreas: Unremarkable. No pancreatic ductal dilatation or surrounding inflammatory changes. Spleen: The spleen is well enhanced with a few small hypodensities within. These may represent small cysts but again metastatic disease could not be totally excluded on the basis of this exam. These were not well visualized on the prior exam. Adrenals/Urinary Tract: Adrenal glands are unremarkable. Kidneys are normal, without renal calculi, focal lesion, or hydronephrosis. Bladder is unremarkable. 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 No obstructive changes are seen. Some fluid-filled loops are noted within the mid abdomen similar to that seen on the prior exam. No obstructive changes are noted. No obstructive changes in the colon are seen. Vascular/Lymphatic: Aortic atherosclerosis. No enlarged abdominal or pelvic lymph nodes. Reproductive: Scattered calcifications are  noted within the prostate. Other: Mild free fluid is noted within the abdomen particularly on the right and adjacent to the liver. Mild peritoneal enhancement is noted diffusely. The possibility of peritoneal implants could not be totally excluded. No definitive peritoneal implant is noted. Musculoskeletal: No acute or significant osseous findings. IMPRESSION: Changes consistent with right lower quadrant ostomy. Mild ascites is noted as well as some peritoneal enhancement suggesting the possibility of peritoneal spread of disease although no definitive lesion is noted. Scattered hypodensities in the liver although improved from the prior study. The possibility of metastatic disease deserves consideration. New hypodensities within the spleen which demonstrate enhancement on delayed images. These are somewhat suspicious for neoplastic  involvement as well. No other focal abnormality is seen. Electronically Signed   By: Inez Catalina M.D.   On: 07/04/2016 08:34    Procedures Procedures (including critical care time)  Medications Ordered in ED Medications  HYDROmorphone (DILAUDID) injection 1 mg (1 mg Intravenous Given 07/04/16 0745)  ondansetron (ZOFRAN) injection 4 mg (4 mg Intravenous Given 07/04/16 0745)  sodium chloride 0.9 % bolus 1,000 mL (0 mLs Intravenous Stopped 07/04/16 0851)  iopamidol (ISOVUE-300) 61 % injection 100 mL (100 mLs Intravenous Contrast Given 07/04/16 0814)  HYDROmorphone (DILAUDID) injection 1 mg (1 mg Intravenous Given 07/04/16 0857)  oxyCODONE (Oxy IR/ROXICODONE) immediate release tablet 10 mg (10 mg Oral Given 07/04/16 1036)     Initial Impression / Assessment and Plan / ED Course  I have reviewed the triage vital signs and the nursing notes.  Pertinent labs & imaging results that were available during my care of the patient were reviewed by me and considered in my medical decision making (see chart for details).  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 On arrival, VSS. No fever or signs of sepsis. Exam is as above. Pt overall non-toxic appearing. Given h/o CA, concern for worsening metastatic disease, peri/retroperitoneal mets, colitis. Will check labs, CT, re-assess.  Labs, imaging as above. CBC with no leukocytosis. CMP unremarkable - normal LFTs, Bili. Renal fxn at baseline. UA without signs of UTI. CT shows metastatic peritoneal disease, possible new splenic mets, liver mets. Discussed with pt's Oncologist at [facility]. Per review of records and discussion, spleen mets may be new but pt o/w has known peritoneal mets. VS o/w stable, pain improving, and pt well-appearing. Per discussion with pt's Oncologist as well as pt and pt's wife, current plan is to provide additional analgesia for breakthrough, and pt  will f/u with Oncologist on Monday. No other apparent emeregent etiology for pain. Pt in agreement. Per discussion with pt's Oncologist, will provide #20 oxy 10 mg for breakthrough and he will refill on Monday.  Final Clinical Impressions(s) / ED Diagnoses   Final diagnoses:  Cancer associated pain    New Prescriptions Discharge Medication List as of 07/04/2016 10:23 AM    START taking these medications   Details  oxyCODONE (ROXICODONE) 5 MG immediate release tablet Take 1-2 tablets (5-10 mg total) by mouth every 4 (four) hours as needed for severe pain or breakthrough pain., Starting Sat 07/04/2016, Print         Duffy Bruce, MD 07/04/16 [phone]

## 2016-07-04 NOTE — ED Notes (Signed)
Made Dr Ellender Hose aware that wife is wanting CT results as soon as possible, due to her having to go to work.

## 2016-07-04 NOTE — Discharge Instructions (Signed)
You can take your percocet as prescribed. Take one to two oxycodone in addition to this as needed for severe or breakthrough pain.

## 2016-07-08 ENCOUNTER — Encounter (HOSPITAL_COMMUNITY): Payer: Self-pay | Admitting: Emergency Medicine

## 2016-07-08 ENCOUNTER — Emergency Department (HOSPITAL_COMMUNITY)
Admission: EM | Admit: 2016-07-08 | Discharge: 2016-07-08 | Disposition: A | Discharge status: Home / Self Care | Payer: Medicaid Other | Attending: Emergency Medicine | Admitting: Emergency Medicine

## 2016-07-08 ENCOUNTER — Emergency Department (HOSPITAL_COMMUNITY): Payer: Medicaid Other

## 2016-07-08 DIAGNOSIS — C189 Malignant neoplasm of colon, unspecified: Secondary | ICD-10-CM | POA: Diagnosis not present

## 2016-07-08 DIAGNOSIS — I10 Essential (primary) hypertension: Secondary | ICD-10-CM | POA: Insufficient documentation

## 2016-07-08 DIAGNOSIS — G893 Neoplasm related pain (acute) (chronic): Secondary | ICD-10-CM | POA: Diagnosis not present

## 2016-07-08 DIAGNOSIS — Z87891 Personal history of nicotine dependence: Secondary | ICD-10-CM | POA: Diagnosis not present

## 2016-07-08 DIAGNOSIS — K5900 Constipation, unspecified: Secondary | ICD-10-CM | POA: Diagnosis not present

## 2016-07-08 DIAGNOSIS — R109 Unspecified abdominal pain: Secondary | ICD-10-CM

## 2016-07-08 LAB — COMPREHENSIVE METABOLIC PANEL
ALT: 44 U/L (ref 17–63)
ANION GAP: 13 (ref 5–15)
AST: 52 U/L — ABNORMAL HIGH (ref 15–41)
Albumin: 4.8 g/dL (ref 3.5–5.0)
Alkaline Phosphatase: 97 U/L (ref 38–126)
BUN: 18 mg/dL (ref 6–20)
CO2: 28 mmol/L (ref 22–32)
CREATININE: 0.84 mg/dL (ref 0.61–1.24)
Calcium: 10.4 mg/dL — ABNORMAL HIGH (ref 8.9–10.3)
Chloride: 89 mmol/L — ABNORMAL LOW (ref 101–111)
Glucose, Bld: 137 mg/dL — ABNORMAL HIGH (ref 65–99)
POTASSIUM: 3.5 mmol/L (ref 3.5–5.1)
SODIUM: 130 mmol/L — AB (ref 135–145)
Total Bilirubin: 1.1 mg/dL (ref 0.3–1.2)
Total Protein: 9.3 g/dL — ABNORMAL HIGH (ref 6.5–8.1)

## 2016-07-08 LAB — URINALYSIS, ROUTINE W REFLEX MICROSCOPIC
BILIRUBIN URINE: NEGATIVE
Glucose, UA: NEGATIVE mg/dL
HGB URINE DIPSTICK: NEGATIVE
KETONES UR: NEGATIVE mg/dL
LEUKOCYTES UA: NEGATIVE
Nitrite: NEGATIVE
PH: 5 (ref 5.0–8.0)
Protein, ur: 30 mg/dL — AB
Specific Gravity, Urine: 1.021 (ref 1.005–1.030)

## 2016-07-08 LAB — CBC
HCT: 42 % (ref 39.0–52.0)
HEMOGLOBIN: 14.4 g/dL (ref 13.0–17.0)
MCH: 29.8 pg (ref 26.0–34.0)
MCHC: 34.3 g/dL (ref 30.0–36.0)
MCV: 87 fL (ref 78.0–100.0)
PLATELETS: 295 10*3/uL (ref 150–400)
RBC: 4.83 MIL/uL (ref 4.22–5.81)
RDW: 15.8 % — ABNORMAL HIGH (ref 11.5–15.5)
WBC: 9.3 10*3/uL (ref 4.0–10.5)

## 2016-07-08 LAB — LIPASE, BLOOD: LIPASE: 15 U/L (ref 11–51)

## 2016-07-08 MED ORDER — DOCUSATE SODIUM 100 MG PO CAPS: 100.0000 mg | ORAL_CAPSULE | Freq: Two times a day (BID) | ORAL | 0 refills | Status: AC

## 2016-07-08 MED ORDER — POLYETHYLENE GLYCOL 3350 17 GM/SCOOP PO POWD: 17.0000 g | Freq: Two times a day (BID) | ORAL | 0 refills | Status: AC

## 2016-07-08 MED ORDER — SODIUM CHLORIDE 0.9 % IV BOLUS (SEPSIS)
1000.0000 mL | Freq: Once | INTRAVENOUS | Status: AC
  Administered 2016-07-08: 1000 mL via INTRAVENOUS

## 2016-07-08 MED ORDER — HYDROMORPHONE HCL 1 MG/ML IJ SOLN
1.0000 mg | Freq: Once | INTRAMUSCULAR | Status: AC
  Administered 2016-07-08: 1 mg via INTRAVENOUS
  Filled 2016-07-08: qty 1

## 2016-07-08 MED ORDER — ONDANSETRON HCL 4 MG/2ML IJ SOLN
4.0000 mg | Freq: Once | INTRAMUSCULAR | Status: AC
  Administered 2016-07-08: 4 mg via INTRAVENOUS
  Filled 2016-07-08: qty 2

## 2016-07-08 NOTE — ED Provider Notes (Signed)
[facility] DEPT Provider Note   CSN: [id number] Arrival date & time: 07/08/16  0850     History   Chief Complaint Chief Complaint  Patient presents with  . Abdominal Pain  . Back Pain    HPI Darin Davis is a 56 y.o. male.  Patient with past medical history of stage IV colon cancer presents to the emergency department with chief complaint of abdominal pain. He states that he has recently relocated to [facility], and is establishing care in [place]. He was previously seen at [facility] He states that he has an appointment with his oncologist tomorrow. He states that he has had persistent worsening pain for the past week. He states that he has decreased appetite. He states that he is only able to eat a couple of bites of a sandwich per day. He complains of a 20 pound weight loss over the past month. He states that he has been having normal bowel movements through a colostomy. He reports subjective fever and chills, but denies ever having measured his temperature. He is taking oxycodone and Percocet for pain. There are no other new or associated symptoms.  Patient wife states that his ostomy is red, instead of pink.  Denies any discharge or pain around the ostomy.   The history is provided by the patient. No language interpreter was used.    Past Medical History:  Diagnosis Date  . Colon cancer ([place])   . Colon cancer ([place])   . Hypertension     There are no active problems to display for this patient.   Past Surgical History:  Procedure Laterality Date  . COLOSTOMY    . HERNIA REPAIR    . PORTACATH PLACEMENT         Home Medications    Prior to Admission medications   Medication Sig Start Date End Date Taking? Authorizing Provider  gabapentin (NEURONTIN) 300 MG capsule Take 300 mg by mouth daily. 02/12/16 02/11/17  Historical Provider, MD  Menthol-Camphor (ICY HOT ADVANCED RELIEF) 16-11 % CREA Apply 1 application topically daily as needed (PAIN).     Historical Provider, MD  omeprazole (PRILOSEC) 40 MG capsule Take 40 mg by mouth daily. 04/20/16   Historical Provider, MD  oxyCODONE (ROXICODONE) 5 MG immediate release tablet Take 1-2 tablets (5-10 mg total) by mouth every 4 (four) hours as needed for severe pain or breakthrough pain. 07/04/16   Duffy Bruce, MD  oxyCODONE-acetaminophen (PERCOCET/ROXICET) 5-325 MG tablet Take 1-2 tablets by mouth every 6 (six) hours as needed for severe pain. 06/25/16   Jaime Pilcher Ward, PA-C  VOLTAREN 1 % GEL Apply 1 application topically daily. 04/19/16   Historical Provider, MD    Family History Family History  Problem Relation Age of Onset  . Heart failure Mother     Social History Social History  Substance Use Topics  . Smoking status: Former Research scientist (life sciences)  . Smokeless tobacco: Never Used  . Alcohol use No     Allergies   Patient has no known allergies.   Review of Systems Review of Systems   Physical Exam Updated Vital Signs BP (!) 148/101 (BP Location: Left Arm)   Pulse 104   Temp 97.8 F (36.6 C) (Oral)   Resp 19   SpO2 99%   Physical Exam  Constitutional: He is oriented to person, place, and time. He appears well-developed and well-nourished.  HENT:  Head: Normocephalic and atraumatic.  Eyes: Conjunctivae and EOM are normal. Pupils are equal, round, and  reactive to light. Right eye exhibits no discharge. Left eye exhibits no discharge. No scleral icterus.  Neck: Normal range of motion. Neck supple. No JVD present.  Cardiovascular: Normal rate, regular rhythm and normal heart sounds.  Exam reveals no gallop and no friction rub.   No murmur heard. Pulmonary/Chest: Effort normal and breath sounds normal. No respiratory distress. He has no wheezes. He has no rales. He exhibits no tenderness.  Abdominal: Soft. He exhibits no distension and no mass. There is no tenderness. There is no rebound and no guarding.  Ostomy in RLQ, no surrounding erythema, or purulent discharge, vague  abdominal discomfort with palpation, but no focal tenderness  Musculoskeletal: Normal range of motion. He exhibits no edema or tenderness.  Neurological: He is alert and oriented to person, place, and time.  Skin: Skin is warm and dry.  Psychiatric: He has a normal mood and affect. His behavior is normal. Judgment and thought content normal.  Nursing note and vitals reviewed.    ED Treatments / Results  Labs (all labs ordered are listed, but only abnormal results are displayed) Labs Reviewed  CBC - Abnormal; Notable for the following:       Result Value   RDW 15.8 (*)    All other components within normal limits  LIPASE, BLOOD  COMPREHENSIVE METABOLIC PANEL  URINALYSIS, ROUTINE W REFLEX MICROSCOPIC    EKG  EKG Interpretation None       Radiology No results found.  Procedures Procedures (including critical care time)  Medications Ordered in ED Medications  HYDROmorphone (DILAUDID) injection 1 mg (not administered)  ondansetron (ZOFRAN) injection 4 mg (not administered)     Initial Impression / Assessment and Plan / ED Course  I have reviewed the triage vital signs and the nursing notes.  Pertinent labs & imaging results that were available during my care of the patient were reviewed by me and considered in my medical decision making (see chart for details).  Clinical Course     Patient with chronic abdominal pain. Has stage IV colon cancer. Working to get established with oncology in [place]. He has an appointment tomorrow. He states that he has had increased pain over the past several weeks. He believes his pain medicines are not strong enough. He takes oxycodone 2 mg every 4, and 1 Percocet 5-325 every 6. His laboratory workup is reassuring. He is not in any apparent distress. His vital signs are stable. Have encouraged him to increase his Percocet to 2 tablets every 6. He has enough these currently. He will follow-up with his oncologist tomorrow. Patient seen by  and discussed with Dr. Rex Kras, who agrees with plan. Patient is stable and ready for discharge.  Final Clinical Impressions(s) / ED Diagnoses   Final diagnoses:  Abdominal pain, unspecified abdominal location  Cancer associated pain  Constipation, unspecified constipation type    New Prescriptions New Prescriptions   DOCUSATE SODIUM (COLACE) 100 MG CAPSULE    Take 1 capsule (100 mg total) by mouth 2 (two) times daily.   POLYETHYLENE GLYCOL POWDER (GLYCOLAX/MIRALAX) POWDER    Take 17 g by mouth 2 (two) times daily. Until daily soft stools  OTC     Montine Circle, PA-C 07/08/16 [place], MD 07/08/16 [phone]

## 2016-07-08 NOTE — ED Triage Notes (Signed)
Patient still having abd pain and back pain.  Patient has cancer.  Patient wife states that ostomy is now bright red instead of pink like it has been before.  Patient is losing weight because he cant eat. Patient last chemo was first December.  patient was supposed to go back to cancer apt Monday but was feeling well enough to go.

## 2016-07-09 ENCOUNTER — Ambulatory Visit: Payer: Medicaid Other | Admitting: Hematology

## 2016-07-09 ENCOUNTER — Telehealth: Payer: Self-pay | Admitting: Hematology

## 2016-07-09 DIAGNOSIS — C186 Malignant neoplasm of descending colon: Secondary | ICD-10-CM | POA: Insufficient documentation

## 2016-07-09 NOTE — Progress Notes (Deleted)
[facility]  [phone] [phone] [place] Note   Patient Care Team: Su Grand, MD as PCP - General (Internal Medicine) 07/09/2016  CHIEF COMPLAINTS/PURPOSE OF CONSULTATION:  ***  Oncology History   Cancer of left colon [facility])   Staging form: Colon and Rectum - Neuroendocine Tumors, AJCC 8th Edition   - Clinical: Stage IV (cT3, cNX, pM1b) - Signed by Truitt Merle, MD on 07/09/2016      Cancer of left colon ([place])   02/27/2015 Imaging    CT AP: Omental caking and peritoneal nodular lesions primarily in the left aspect of the abdomen. These findings are consistent with extensive peritoneal malignancy. The most likely location for the primary tumor is the mid descending colon       02/28/2015 Initial Diagnosis    Cancer of left colon ([place])      02/28/2015 Initial Biopsy     Colon, descending, biopsy - Invasive mod. to poorly differentiated adenocarcinoma B: Colon, sigmoid, polypectomy - Focal intramucosal adenocarcinoma, arising in a tubular adenoma . K-ras mutated      02/28/2015 Miscellaneous    Colon cancer (RAF-HCC): This tumor exhibits a microsatellite stable (MSS) phenotype; KRAS: c.33_[id number] [p.Gly12Cys] BRAF: None detected NRAS: None detected TP53: c.524G>A [p.Arg175His] PIK3CA: None detected AKT1: None detected      03/01/2015 Surgery    Diverting ileostomy :Metastatic moderately differentiated adenocarcinoma w/ prominent desmoplastic response, c/w metastases from colorectal primary - B: Omentum, biopsy - Metastatic moderately differentiated adenocarcinoma       07/26/2015 - 10/14/2015 Chemotherapy     FOLFOX-Bev started with cycle 8. CEA 12.9.       09/02/2015 Imaging     CT CAP: No bowel obstruction s/p interval right lower quadrant diverting ileostomy. -- 62m enhancing soft tissue implant adjacent to the greater curvature of the stomach. Status post omentectomy. 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 Recommend short interval follow-up and correlation with CEA. - Stable 0.8 cm soft tissue implant adjacent to the greater curvature of stomach. Status post omentectomy. - 0.9 cm precaval lymph node, unchanged - Diffuse hepatic steatosis.      01/14/2016 Imaging    CT CAP: No evidence of metastatic disease within the chest. Peritoneal disease, not substantially changed compared with 12/02/2015. No new sites of disease identified.      02/11/2016 Tumor Marker    CEA 129      03/11/2016 -  Chemotherapy    FOLFIRI-Bev       03/23/2016 Tumor Marker    CEA 227      05/25/2016 Tumor Marker    CEA 172          HISTORY OF PRESENTING ILLNESS:  Darin Maxin89y.o. male is here because of ***  MEDICAL HISTORY:  Past Medical History:  Diagnosis Date  . Colon cancer ([facility]   . [place] cancer ([facility]   . Hypertension     SURGICAL HISTORY: Past Surgical History:  Procedure Laterality Date  . COLOSTOMY    . HERNIA REPAIR    . PORTACATH PLACEMENT      SOCIAL HISTORY: Social History   Social History  . Marital status: Married    Spouse name: N/A  .  Number of children: N/A  . Years of education: N/A   Occupational History  . Not on file.   Social History Main Topics  . Smoking status: Former Research scientist (life sciences)  . Smokeless tobacco: Never Used  . Alcohol use No  . Drug use: No  . Sexual activity: Not on file   Other Topics Concern  . Not on file   Social History Narrative  . No narrative on file    FAMILY HISTORY: Family History  Problem Relation Age of Onset  . Heart failure Mother     ALLERGIES:  has No Known Allergies.  MEDICATIONS:  Current Outpatient Prescriptions  Medication Sig Dispense Refill  . docusate  sodium (COLACE) 100 MG capsule Take 1 capsule (100 mg total) by mouth 2 (two) times daily. 30 capsule 0  . gabapentin (NEURONTIN) 300 MG capsule Take 300 mg by mouth daily as needed (pain).     . Menthol-Camphor (ICY HOT ADVANCED RELIEF) 16-11 % CREA Apply 1 application topically daily as needed (PAIN).    Marland Kitchen omeprazole (PRILOSEC) 40 MG capsule Take 40 mg by mouth daily as needed (heartburn).   1  . oxyCODONE (ROXICODONE) 5 MG immediate release tablet Take 1-2 tablets (5-10 mg total) by mouth every 4 (four) hours as needed for severe pain or breakthrough pain. 20 tablet 0  . oxyCODONE-acetaminophen (PERCOCET/ROXICET) 5-325 MG tablet Take 1-2 tablets by mouth every 6 (six) hours as needed for severe pain. 20 tablet 0  . polyethylene glycol powder (GLYCOLAX/MIRALAX) powder Take 17 g by mouth 2 (two) times daily. Until daily soft stools  OTC 250 g 0  . VOLTAREN 1 % GEL Apply 1 application topically daily as needed (pain).   0   No current facility-administered medications for this visit.     REVIEW OF SYSTEMS:   Constitutional: Denies fevers, chills or abnormal night sweats Eyes: Denies blurriness of vision, double vision or watery eyes Ears, nose, mouth, throat, and face: Denies mucositis or sore throat Respiratory: Denies cough, dyspnea or wheezes Cardiovascular: Denies palpitation, chest discomfort or lower extremity swelling Gastrointestinal:  Denies nausea, heartburn or change in bowel habits Skin: Denies abnormal skin rashes Lymphatics: Denies new lymphadenopathy or easy bruising Neurological:Denies numbness, tingling or new weaknesses Behavioral/Psych: Mood is stable, no new changes  All other systems were reviewed with the patient and are negative.  PHYSICAL EXAMINATION: ECOG PERFORMANCE STATUS: {CHL ONC ECOG PS:[id number]}  There were no vitals filed for this visit. There were no vitals filed for this visit.  GENERAL:alert, no distress and comfortable SKIN: skin color, texture,  turgor are normal, no rashes or significant lesions EYES: normal, conjunctiva are pink and non-injected, sclera clear OROPHARYNX:no exudate, no erythema and lips, buccal mucosa, and tongue normal  NECK: supple, thyroid normal size, non-tender, without nodularity LYMPH:  no palpable lymphadenopathy in the cervical, axillary or inguinal LUNGS: clear to auscultation and percussion with normal breathing effort HEART: regular rate & rhythm and no murmurs and no lower extremity edema ABDOMEN:abdomen soft, non-tender and normal bowel sounds Musculoskeletal:no cyanosis of digits and no clubbing  PSYCH: alert & oriented x 3 with fluent speech NEURO: no focal motor/sensory deficits  LABORATORY DATA:  I have reviewed the data as listed CBC Latest Ref Rng & Units 07/08/2016 07/04/2016 06/25/2016  WBC 4.0 - 10.5 K/uL 9.3 5.5 6.2  Hemoglobin 13.0 - 17.0 g/dL 14.4 12.4(L) 11.1(L)  Hematocrit 39.0 - 52.0 % 42.0 37.5(L) 34.2(L)  Platelets 150 - 400 K/uL 295 265 247   CMP  Latest Ref Rng & Units 07/08/2016 07/04/2016 06/25/2016  Glucose 65 - 99 mg/dL 137(H) 108(H) 123(H)  BUN 6 - 20 mg/dL 18 10 10   Creatinine 0.61 - 1.24 mg/dL 0.84 0.72 0.67  Sodium 135 - 145 mmol/L 130(L) 132(L) 135  Potassium 3.5 - 5.1 mmol/L 3.5 3.8 3.8  Chloride 101 - 111 mmol/L 89(L) 96(L) 100(L)  CO2 22 - 32 mmol/L 28 25 27   Calcium 8.9 - 10.3 mg/dL 10.4(H) 9.9 9.6  Total Protein 6.5 - 8.1 g/dL 9.3(H) 8.2(H) 7.3  Total Bilirubin 0.3 - 1.2 mg/dL 1.1 0.8 0.6  Alkaline Phos 38 - 126 U/L 97 74 66  AST 15 - 41 U/L 52(H) 30 29  ALT 17 - 63 U/L 44 21 25    CEA  Date Value Ref Range Status  05/25/2016 172.0 (H) 0.0 - 5.0 ng/mL Final  04/20/2016 187.0 (H) 0.0 - 5.0 ng/mL Final  03/23/2016 227.0 (H) 0.0 - 5.0 ng/mL Final  03/11/2016 207.0 (H) 0.0 - 5.0 ng/mL Final  02/25/2016 175.0 (H) 0.0 - 5.0 ng/mL Final  02/11/2016 129.0 (H) 0.0 - 5.0 ng/mL Final  09/02/2015 5.0 0.0 - 5.0 ng/mL Final  08/05/2015 6.3 (H) 0.0 - 5.0 ng/mL Final    07/08/2015 12.9 (H) 0.0 - 5.0 ng/mL Final  06/03/2015 45.7 (H) 0.0 - 5.0 ng/mL Final  04/08/2015 82.9 (H) 0.0 - 5.0 ng/mL Final  03/29/2015 57.4 (H) 0.0 - 5.0 ng/mL Final  02/27/2015 63.8 (H) 0.0 - 5.0 ng/mL Final        PATHOLOGY RESULT    RADIOGRAPHIC STUDIES: I have personally reviewed the radiological images as listed and agreed with the findings in the report. Ct Abdomen Pelvis W Contrast  Result Date: 07/04/2016 CLINICAL DATA:  Lower abdominal pain, history of colon carcinoma EXAM: CT ABDOMEN AND PELVIS WITH CONTRAST TECHNIQUE: Multidetector CT imaging of the abdomen and pelvis was performed using the standard protocol following bolus administration of intravenous contrast. CONTRAST:  138m ISOVUE-300 IOPAMIDOL (ISOVUE-300) INJECTION 61% COMPARISON:  04/29/2015 FINDINGS: Lower chest: No acute abnormality. Hepatobiliary: The liver is well visualized and demonstrates a few scattered hypodensities but less present than that seen on the prior exam. These may represent small cysts although the possibility of metastatic disease deserves consideration given the patient's clinical history. The gallbladder is within normal limits. Pancreas: Unremarkable. No pancreatic ductal dilatation or surrounding inflammatory changes. Spleen: The spleen is well enhanced with a few small hypodensities within. These may represent small cysts but again metastatic disease could not be totally excluded on the basis of this exam. These were not well visualized on the prior exam. Adrenals/Urinary Tract: Adrenal glands are unremarkable. Kidneys are normal, without renal calculi, focal lesion, or hydronephrosis. Bladder is unremarkable. 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 No obstructive changes are seen. Some fluid-filled loops are noted within the mid abdomen similar to that seen on the prior exam. No obstructive changes are noted. No obstructive changes in the colon are seen.  Vascular/Lymphatic: Aortic atherosclerosis. No enlarged abdominal or pelvic lymph nodes. Reproductive: Scattered calcifications are noted within the prostate. Other: Mild free fluid is noted within the abdomen particularly on the right and adjacent to the liver. Mild peritoneal enhancement is noted diffusely. The possibility of peritoneal implants could not be totally excluded. No definitive peritoneal implant is noted. Musculoskeletal: No acute or significant osseous findings. IMPRESSION: Changes consistent with right lower quadrant ostomy. Mild ascites is noted as well as some peritoneal enhancement suggesting the possibility of peritoneal spread  of disease although no definitive lesion is noted. Scattered hypodensities in the liver although improved from the prior study. The possibility of metastatic disease deserves consideration. New hypodensities within the spleen which demonstrate enhancement on delayed images. These are somewhat suspicious for neoplastic involvement as well. No other focal abnormality is seen. Electronically Signed   By: Inez Catalina M.D.   On: 07/04/2016 08:34   Dg Abd Acute W/chest  Result Date: 07/08/2016 CLINICAL DATA:  Abdomen and low back pain, some nausea EXAM: DG ABDOMEN ACUTE W/ 1V CHEST COMPARISON:  CT abdomen pelvis of 07/04/2016 FINDINGS: No active infiltrate or effusion is seen. The lungs are slightly hyperaerated. A right-sided Port-A-Cath is present with tip overlying the expected right atrium. Mediastinal and hilar contours are unremarkable. The heart is within normal limits in size. Supine and erect views of the abdomen show both large and small bowel gas to be present without significant distension. An ostomy is noted in the right lower quadrant. No free air is seen on the erect view. No opaque calculi are noted. IMPRESSION: 1. No active lung disease.  Slight hyper aeration. 2. Port-A-Cath tip in the region of the right atrium. 3. No bowel obstruction. No free air.  Ostomy the right lower quadrant. Electronically Signed   By: Ivar Drape M.D.   On: 07/08/2016 11:01    ASSESSMENT & PLAN:  *** No orders of the defined types were placed in this encounter.   All questions were answered. The patient knows to call the clinic with any problems, questions or concerns. I spent {CHL ONC TIME VISIT - VHQIT:[id number]} counseling the patient face to face. The total time spent in the appointment was {CHL ONC TIME VISIT - OPRAF:[id number]} and more than 50% was on counseling.     Truitt Merle, MD 07/09/2016 6:46 AM

## 2016-07-09 NOTE — Telephone Encounter (Signed)
Wife called to r/s pt appt for 1/11 due to unable to get off work. Pt was seen in ER the night before. Gave pt new appt date/time

## 2016-07-14 ENCOUNTER — Encounter (HOSPITAL_COMMUNITY): Payer: Self-pay

## 2016-07-14 ENCOUNTER — Emergency Department (HOSPITAL_COMMUNITY)
Admission: EM | Admit: 2016-07-14 | Discharge: 2016-07-14 | Disposition: A | Discharge status: Home / Self Care | Payer: Medicaid Other | Attending: Emergency Medicine | Admitting: Emergency Medicine

## 2016-07-14 DIAGNOSIS — I10 Essential (primary) hypertension: Secondary | ICD-10-CM | POA: Insufficient documentation

## 2016-07-14 DIAGNOSIS — R1084 Generalized abdominal pain: Secondary | ICD-10-CM | POA: Insufficient documentation

## 2016-07-14 DIAGNOSIS — Z79899 Other long term (current) drug therapy: Secondary | ICD-10-CM | POA: Diagnosis not present

## 2016-07-14 DIAGNOSIS — R109 Unspecified abdominal pain: Secondary | ICD-10-CM | POA: Diagnosis present

## 2016-07-14 DIAGNOSIS — Z87891 Personal history of nicotine dependence: Secondary | ICD-10-CM | POA: Insufficient documentation

## 2016-07-14 DIAGNOSIS — C189 Malignant neoplasm of colon, unspecified: Secondary | ICD-10-CM | POA: Insufficient documentation

## 2016-07-14 LAB — COMPREHENSIVE METABOLIC PANEL
ALBUMIN: 4.4 g/dL (ref 3.5–5.0)
ALT: 41 U/L (ref 17–63)
ANION GAP: 9 (ref 5–15)
AST: 45 U/L — ABNORMAL HIGH (ref 15–41)
Alkaline Phosphatase: 96 U/L (ref 38–126)
BILIRUBIN TOTAL: 0.8 mg/dL (ref 0.3–1.2)
BUN: 12 mg/dL (ref 6–20)
CO2: 27 mmol/L (ref 22–32)
Calcium: 10 mg/dL (ref 8.9–10.3)
Chloride: 95 mmol/L — ABNORMAL LOW (ref 101–111)
Creatinine, Ser: 0.8 mg/dL (ref 0.61–1.24)
GFR calc Af Amer: >60 mL/min (ref >60)
GFR calc non Af Amer: >60 mL/min (ref >60)
GLUCOSE: 133 mg/dL — AB (ref 65–99)
POTASSIUM: 4.3 mmol/L (ref 3.5–5.1)
Sodium: 131 mmol/L — ABNORMAL LOW (ref 135–145)
TOTAL PROTEIN: 8.2 g/dL — AB (ref 6.5–8.1)

## 2016-07-14 LAB — URINALYSIS, ROUTINE W REFLEX MICROSCOPIC
BILIRUBIN URINE: NEGATIVE
Glucose, UA: NEGATIVE mg/dL
Hgb urine dipstick: NEGATIVE
KETONES UR: NEGATIVE mg/dL
NITRITE: NEGATIVE
PH: 5 (ref 5.0–8.0)
PROTEIN: 30 mg/dL — AB
RBC / HPF: NONE SEEN RBC/hpf (ref 0–5)
Specific Gravity, Urine: 1.034 — ABNORMAL HIGH (ref 1.005–1.030)

## 2016-07-14 LAB — CBC
HEMATOCRIT: 39.7 % (ref 39.0–52.0)
HEMOGLOBIN: 13.4 g/dL (ref 13.0–17.0)
MCH: 29.6 pg (ref 26.0–34.0)
MCHC: 33.8 g/dL (ref 30.0–36.0)
MCV: 87.8 fL (ref 78.0–100.0)
Platelets: 363 10*3/uL (ref 150–400)
RBC: 4.52 MIL/uL (ref 4.22–5.81)
RDW: 16.2 % — AB (ref 11.5–15.5)
WBC: 9.5 10*3/uL (ref 4.0–10.5)

## 2016-07-14 LAB — LIPASE, BLOOD: LIPASE: 19 U/L (ref 11–51)

## 2016-07-14 MED ORDER — DIPHENHYDRAMINE HCL 50 MG/ML IJ SOLN
12.5000 mg | Freq: Once | INTRAMUSCULAR | Status: AC
  Administered 2016-07-14: 12.5 mg via INTRAVENOUS
  Filled 2016-07-14: qty 1

## 2016-07-14 MED ORDER — SODIUM CHLORIDE 0.9 % IV BOLUS (SEPSIS)
1000.0000 mL | Freq: Once | INTRAVENOUS | Status: AC
  Administered 2016-07-14: 1000 mL via INTRAVENOUS

## 2016-07-14 MED ORDER — ONDANSETRON HCL 4 MG/2ML IJ SOLN
4.0000 mg | Freq: Once | INTRAMUSCULAR | Status: AC
  Administered 2016-07-14: 4 mg via INTRAVENOUS
  Filled 2016-07-14: qty 2

## 2016-07-14 MED ORDER — HYDROMORPHONE HCL 1 MG/ML IJ SOLN
1.0000 mg | Freq: Once | INTRAMUSCULAR | Status: AC
  Administered 2016-07-14: 1 mg via INTRAVENOUS
  Filled 2016-07-14: qty 1

## 2016-07-14 MED ORDER — METHOCARBAMOL 500 MG PO TABS: 500.0000 mg | ORAL_TABLET | Freq: Two times a day (BID) | ORAL | 0 refills | Status: AC

## 2016-07-14 MED ORDER — OXYCODONE HCL 5 MG PO TABS: 5.0000 mg | ORAL_TABLET | ORAL | 0 refills | Status: AC | PRN

## 2016-07-14 NOTE — ED Triage Notes (Signed)
Patient c/o mid abdominal pain and low back pain x 3-4 weeks. Patient has a history of stage 4 colon cancer. Patient denies any N/V/D.

## 2016-07-14 NOTE — ED Provider Notes (Signed)
[facility] DEPT Provider Note   CSN: [id number] Arrival date & time: 07/14/16  1427     History   Chief Complaint Chief Complaint  Patient presents with  . Abdominal Pain  . Back Pain    HPI Darin Davis is a 56 y.o. male.  Patient is a 56 year old male with stage IV colon cancer status post resection and ostomy who last used chemotherapy in December and has recently relocated and currently establishing care with oncology and pain management with [facility]. Patient missed his appointment yesterday due to feeling poorly but has an appointment on Tuesday of next week. Patient is here because he is having persistently severe abdominal and back pain. The pain is always there but then he will occasionally have severe waves of pain. It does not seem to be related to movement or eating but he does eat very little. He's lost 20 pounds in the last few months but denies any infectious symptoms such as fever, diarrhea, vomiting. He has no urinary complaints. Patient has been using oxycodone and Percocet at home with only minimal relief. The pain is no different today than it has been the last 2 times he is been at the emergency room he is just tired of suffering. Pain is 10 out of 10 currently and sharp in nature starts in the middle of the abdomen and radiates around to both sides and into his back.   The history is provided by the patient.    Past Medical History:  Diagnosis Date  . Colon cancer ([place])   . Colon cancer ([place])   . Hypertension     Patient Active Problem List   Diagnosis Date Noted  . Cancer of left colon ([place]) 07/09/2016    Past Surgical History:  Procedure Laterality Date  . COLOSTOMY    . HERNIA REPAIR    . PORTACATH PLACEMENT         Home Medications    Prior to Admission medications   Medication Sig Start Date End Date Taking? Authorizing Provider  omeprazole (PRILOSEC) 40 MG capsule Take 40 mg by mouth daily as needed (heartburn).  04/20/16  Yes  Historical Provider, MD  polyethylene glycol powder (GLYCOLAX/MIRALAX) powder Take 17 g by mouth 2 (two) times daily. Until daily soft stools  OTC 07/08/16  Yes Montine Circle, PA-C  VOLTAREN 1 % GEL Apply 1 application topically daily as needed (pain).  04/19/16  Yes Historical Provider, MD  docusate sodium (COLACE) 100 MG capsule Take 1 capsule (100 mg total) by mouth 2 (two) times daily. 07/08/16   Montine Circle, PA-C  gabapentin (NEURONTIN) 300 MG capsule Take 300 mg by mouth daily as needed (pain).  02/12/16 02/11/17  Historical Provider, MD  Menthol-Camphor (ICY HOT ADVANCED RELIEF) 16-11 % CREA Apply 1 application topically daily as needed (PAIN).    Historical Provider, MD  oxyCODONE (ROXICODONE) 5 MG immediate release tablet Take 1-2 tablets (5-10 mg total) by mouth every 4 (four) hours as needed for severe pain or breakthrough pain. Patient not taking: Reported on 07/14/2016 07/04/16   Duffy Bruce, MD  oxyCODONE-acetaminophen (PERCOCET/ROXICET) 5-325 MG tablet Take 1-2 tablets by mouth every 6 (six) hours as needed for severe pain. Patient not taking: Reported on 07/14/2016 06/25/16   Ozella Almond Ward, PA-C    Family History Family History  Problem Relation Age of Onset  . Heart failure Mother     Social History Social History  Substance Use Topics  . Smoking status: Former Research scientist (life sciences)  .  Smokeless tobacco: Never Used  . Alcohol use No     Allergies   Patient has no known allergies.   Review of Systems Review of Systems  All other systems reviewed and are negative.    Physical Exam Updated Vital Signs BP (!) 146/101   Pulse 97   Temp 98.2 F (36.8 C) (Oral)   Resp 16   Ht 6' (1.829 m)   Wt 172 lb (78 kg)   SpO2 99%   BMI 23.33 kg/m   Physical Exam  Constitutional: He is oriented to person, place, and time. He appears well-developed and well-nourished. No distress.  HENT:  Head: Normocephalic and atraumatic.  Mouth/Throat: Oropharynx is clear and moist.    Eyes: Conjunctivae and EOM are normal. Pupils are equal, round, and reactive to light.  Neck: Normal range of motion. Neck supple.  Cardiovascular: Normal rate, regular rhythm and intact distal pulses.   No murmur heard. Pulmonary/Chest: Effort normal and breath sounds normal. No respiratory distress. He has no wheezes. He has no rales.  Abdominal: Soft. He exhibits no distension. There is tenderness. There is no rebound and no guarding.  Mild diffuse tenderness without rebound or guarding. No CVA tenderness.  Ostomy present in the right lower quadrant without surrounding erythema or swelling  Musculoskeletal: Normal range of motion. He exhibits no edema or tenderness.  Neurological: He is alert and oriented to person, place, and time.  Skin: Skin is warm and dry. No rash noted. No erythema.  Psychiatric: He has a normal mood and affect. His behavior is normal.  Nursing note and vitals reviewed.    ED Treatments / Results  Labs (all labs ordered are listed, but only abnormal results are displayed) Labs Reviewed  COMPREHENSIVE METABOLIC PANEL - Abnormal; Notable for the following:       Result Value   Sodium 131 (*)    Chloride 95 (*)    Glucose, Bld 133 (*)    Total Protein 8.2 (*)    AST 45 (*)    All other components within normal limits  CBC - Abnormal; Notable for the following:    RDW 16.2 (*)    All other components within normal limits  LIPASE, BLOOD  URINALYSIS, ROUTINE W REFLEX MICROSCOPIC    EKG  EKG Interpretation None       Radiology No results found.  Procedures Procedures (including critical care time)  Medications Ordered in ED Medications  sodium chloride 0.9 % bolus 1,000 mL (1,000 mLs Intravenous New Bag/Given 07/14/16 1636)  ondansetron (ZOFRAN) injection 4 mg (4 mg Intravenous Given 07/14/16 1637)  HYDROmorphone (DILAUDID) injection 1 mg (1 mg Intravenous Given 07/14/16 1638)     Initial Impression / Assessment and Plan / ED Course  I have  reviewed the triage vital signs and the nursing notes.  Pertinent labs & imaging results that were available during my care of the patient were reviewed by me and considered in my medical decision making (see chart for details).  Clinical Course    Patient is a 56 year old male with known stage IV colon cancer presenting today with uncontrolled pain. Based on patient's history the pain today is no different than what it has been for the last month it is just not getting any better despite him taking pain medication at home. He has not been on any chemotherapy since December 1 because he relocated and is establishing care with [facility]. However patient states he had the pain while he was on the  chemotherapy as well. Patient had a CT scan done last week which showed new densities in the spleen and recurrent densities in the liver as well as peritoneal fluid that was lighting up concerning for metastatic disease. All of these could be contributing to the patient's abdominal pain. He is not vomiting but has early satiety and eats very little.  Patient's labs were normal last week are normal today. Vital signs are within normal limits. Offered patient pain control at this point there is very little that we can do for him in the emergency room but did encourage him to follow-up with pain management and oncology next week as planned. He does complain of a lot of spasms so will add on an anti-spasmodic medication  5:47 PM Pain is improving will give second dose  7:31 PM Patient feeling better and pain is now a 2 out of 10. Will discharge home to follow-up with specialist on Tuesday. Discussed findings with his wife. We'll also add a muscle relaxer and refill his oxycodone prescription.  Final Clinical Impressions(s) / ED Diagnoses   Final diagnoses:  Generalized abdominal pain  Malignant neoplasm of colon, unspecified part of colon (HCC)    New Prescriptions New Prescriptions   METHOCARBAMOL  (ROBAXIN) 500 MG TABLET    Take 1 tablet (500 mg total) by mouth 2 (two) times daily.     Blanchie Dessert, MD 07/14/16 1935

## 2016-07-14 NOTE — ED Notes (Signed)
Patient made aware of urine sample. Patient encouraged to void when able. Patient states he cannot void at this time. EDP at bedside.

## 2016-07-20 ENCOUNTER — Telehealth: Payer: Self-pay | Admitting: Nurse Practitioner

## 2016-07-20 NOTE — Telephone Encounter (Signed)
Late entry from 07/17/16:   Pt presented to cancer center lobby requesting visit with provider. Advised pt that he has not been seen for his initial appt with Dr. Burr Medico as of yet- and he would need to either be seen in the ED or return to [facility] for further follow up.    Pt refused to go to the Emergency Dept here at the hospital.   Pt stated that he would go back to [facility] for further medical management.

## 2016-07-21 ENCOUNTER — Ambulatory Visit: Payer: Medicaid Other | Admitting: Hematology

## 2016-07-21 ENCOUNTER — Telehealth: Payer: Self-pay | Admitting: *Deleted

## 2016-07-21 NOTE — Telephone Encounter (Signed)
Called wife to follow up on missed appointment today. She reports he is still in hospital in [place] with partial small bowel obstruction and hopes to go home soon. She attempted to call office without success. She agrees to call navigator when he is discharged if she still wishes to transfer care here.

## 2016-08-27 DEATH — deceased

## 2018-01-01 IMAGING — CT CT ABD-PELV W/ CM
2 of 5 series · 15 of 46 positions shown, 17 images · IV contrast (iopamidol)
Comparison: 04/29/2015

CLINICAL DATA: Lower abdominal pain, history of colon carcinoma

EXAM:
CT ABDOMEN AND PELVIS WITH CONTRAST
TECHNIQUE: Multidetector CT imaging of the abdomen and pelvis was performed
using the standard protocol following bolus administration of
intravenous contrast.
CONTRAST:  100mL [id number] IOPAMIDOL ([id number]) INJECTION 61%

[Series 2: abd/pel with · axial · 0.71mm/px · z∈[-466,-111]mm · 12 of 85 slices shown, 14 images]
[im 7/85  soft-tissue]
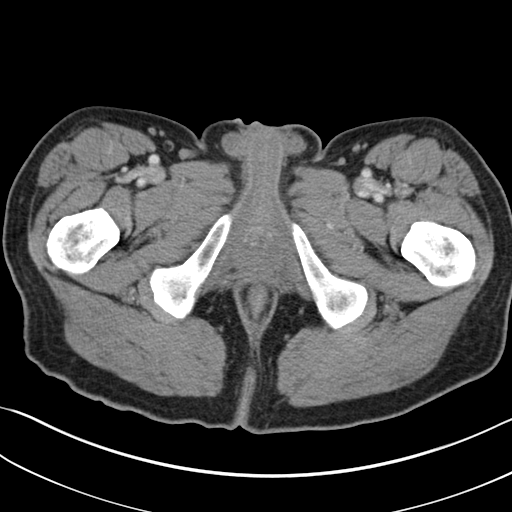
[im 7/85  bone]
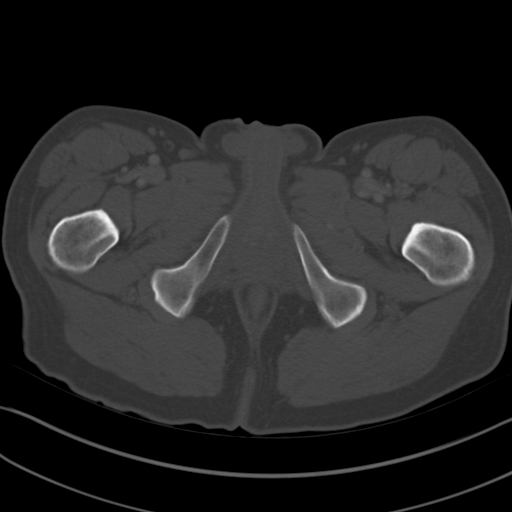
[im 13/85  soft-tissue]
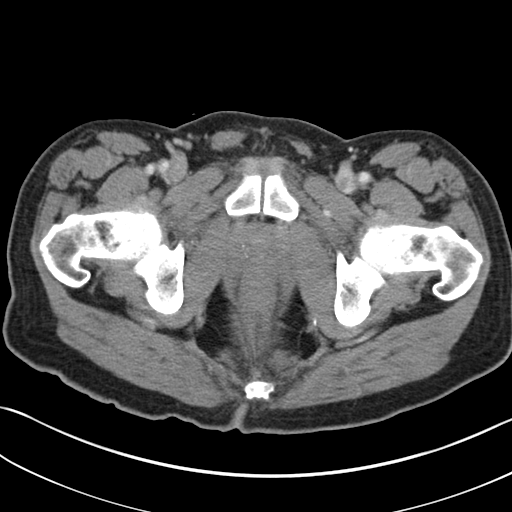
[im 20/85  soft-tissue]
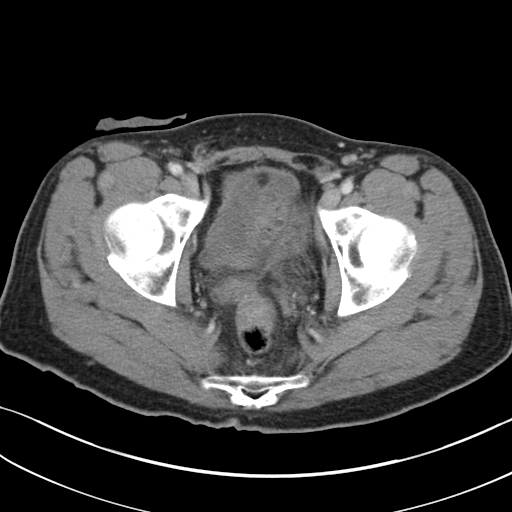
[im 26/85  soft-tissue]
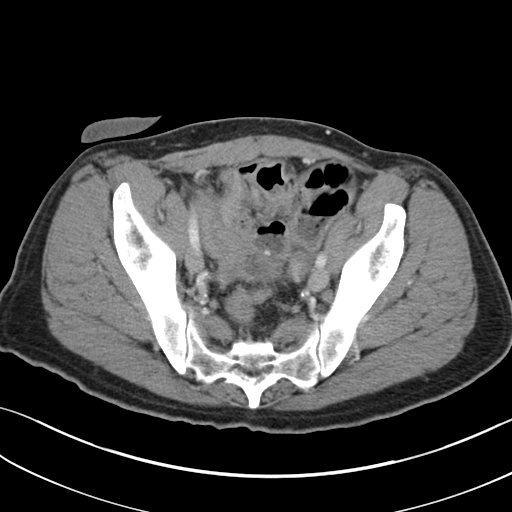
[im 33/85  soft-tissue]
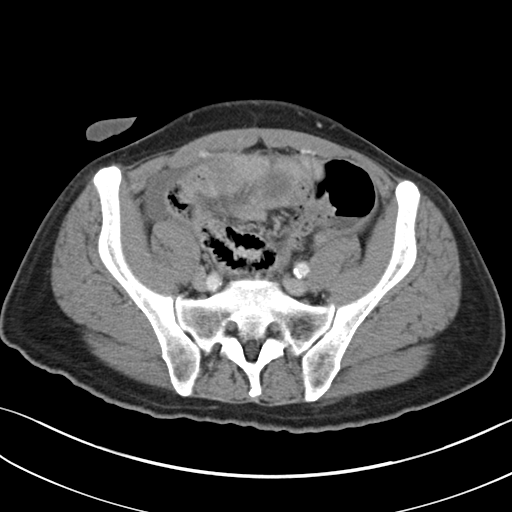
[im 39/85  soft-tissue]
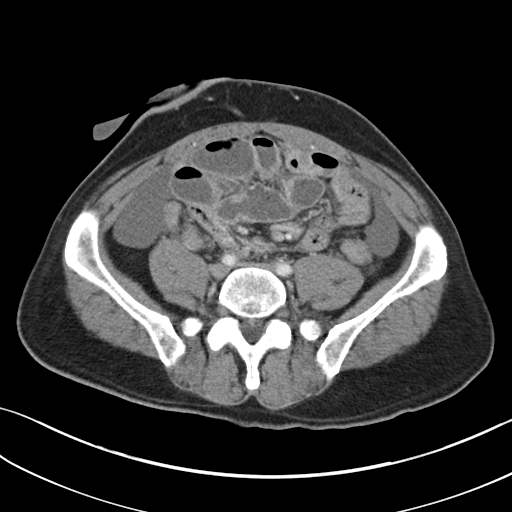
[im 46/85  soft-tissue]
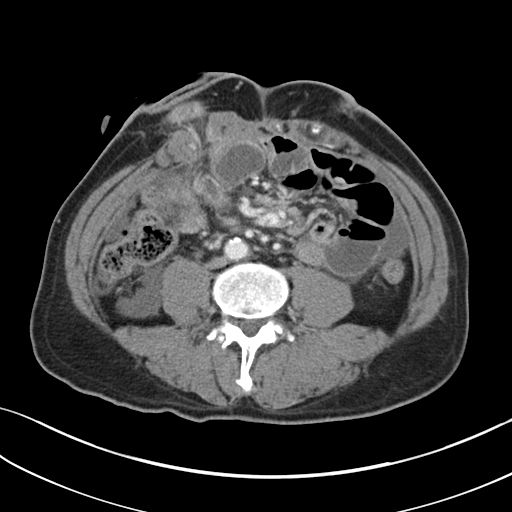
[im 52/85  soft-tissue]
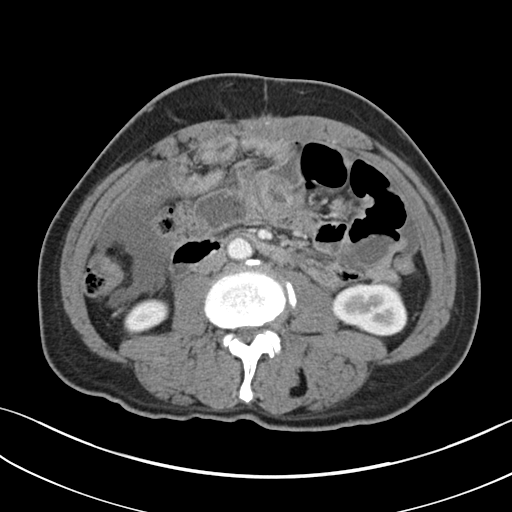
[im 59/85  soft-tissue]
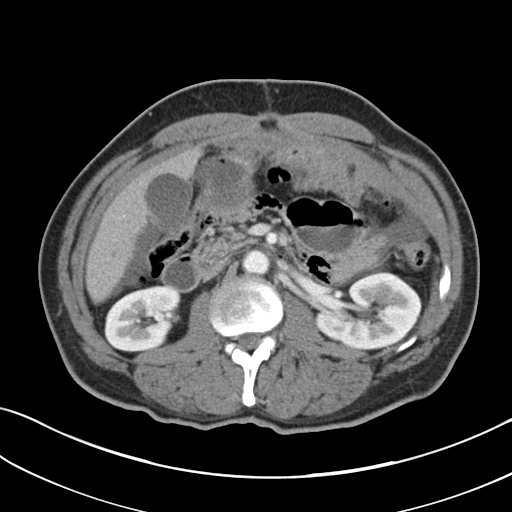
[im 59/85  bone]
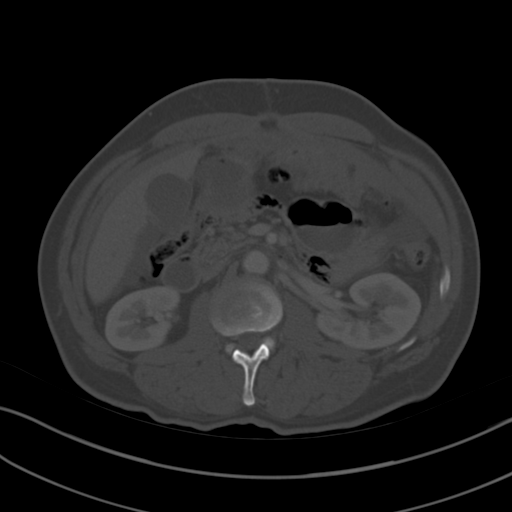
[im 65/85  soft-tissue]
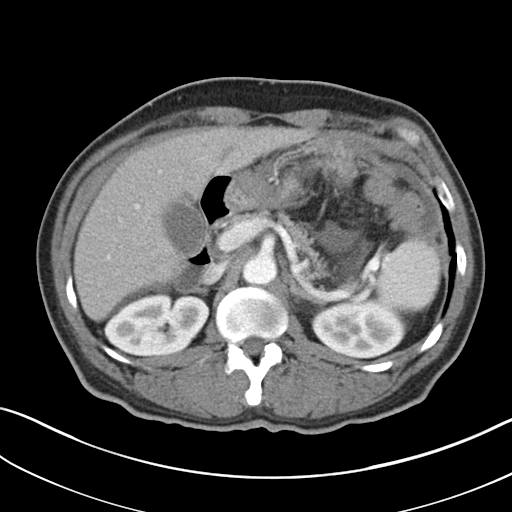
[im 72/85  soft-tissue]
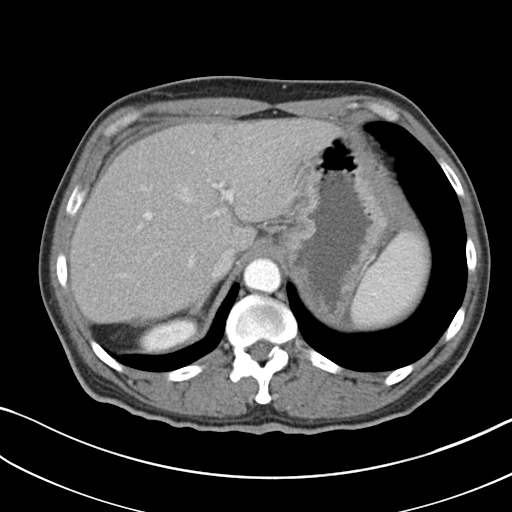
[im 78/85  soft-tissue]
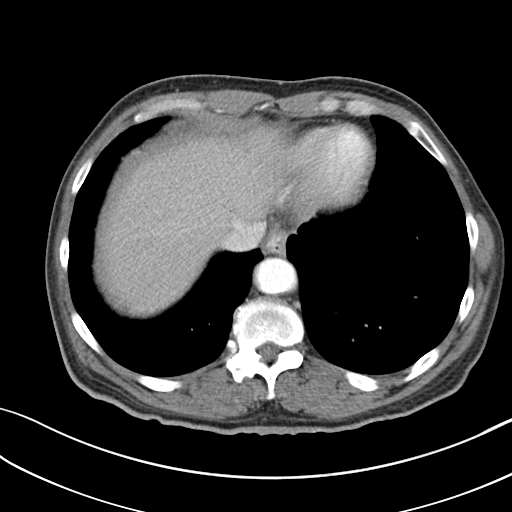

[Series 5: coronal a/|p · coronal · 0.70mm/px · 3 of 121 slices shown]
[im 41/121  soft-tissue]
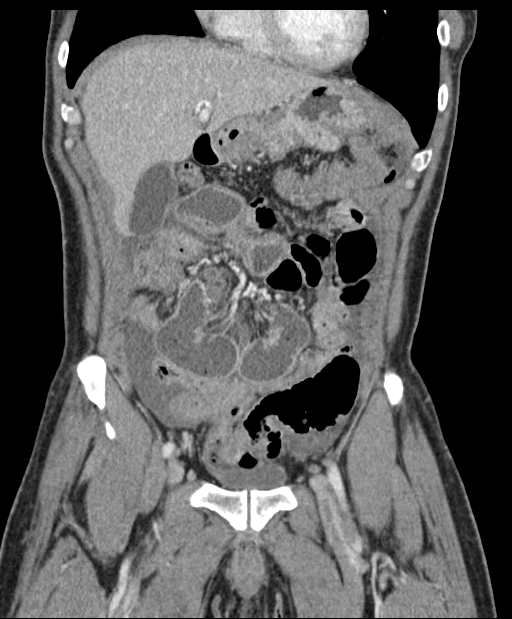
[im 54/121  soft-tissue]
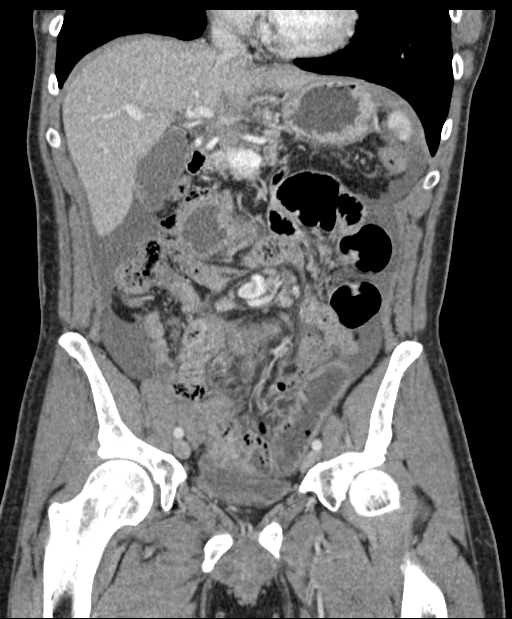
[im 67/121  soft-tissue]
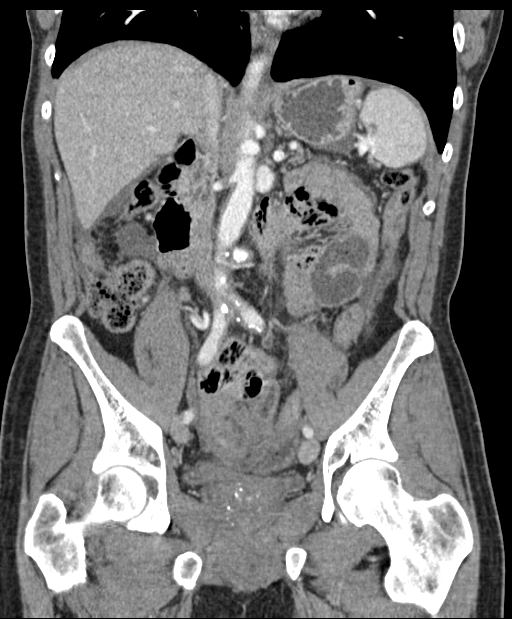

[15 of 46 positions shown; findings below may reference images not displayed]

FINDINGS: Lower chest: No acute abnormality.

Hepatobiliary: The liver is well visualized and demonstrates a few
scattered hypodensities but less present than that seen on the prior
exam. These may represent small cysts although the possibility of
metastatic disease deserves consideration given the patient's
clinical history. The gallbladder is within normal limits.

Pancreas: Unremarkable. No pancreatic ductal dilatation or
surrounding inflammatory changes.

Spleen: The spleen is well enhanced with a few small hypodensities
within. These may represent small cysts but again metastatic disease
could not be totally excluded on the basis of this exam. These were
not well visualized on the prior exam.

Adrenals/Urinary Tract: Adrenal glands are unremarkable. Kidneys are
normal, without renal calculi, focal lesion, or hydronephrosis.
Bladder is unremarkable.

Stomach/Bowel: Right lower quadrant ostomy is again identified. No
obstructive changes are seen. Some fluid-filled loops are noted
within the mid abdomen similar to that seen on the prior exam. No
obstructive changes are noted. No obstructive changes in the colon
are seen.

Vascular/Lymphatic: Aortic atherosclerosis. No enlarged abdominal or
pelvic lymph nodes.

Reproductive: Scattered calcifications are noted within the
prostate.

Other: Mild free fluid is noted within the abdomen particularly on
the right and adjacent to the liver. Mild peritoneal enhancement is
noted diffusely. The possibility of peritoneal implants could not be
totally excluded. No definitive peritoneal implant is noted.

Musculoskeletal: No acute or significant osseous findings.
IMPRESSION: Changes consistent with right lower quadrant ostomy.

Mild ascites is noted as well as some peritoneal enhancement
suggesting the possibility of peritoneal spread of disease although
no definitive lesion is noted.

Scattered hypodensities in the liver although improved from the
prior study. The possibility of metastatic disease deserves
consideration.

New hypodensities within the spleen which demonstrate enhancement on
delayed images. These are somewhat suspicious for neoplastic
involvement as well.

No other focal abnormality is seen.
